# Patient Record
Sex: Female | Born: 1980 | Race: Black or African American | Hispanic: No | Marital: Single | Smoking: Current every day smoker
Health system: Southern US, Community
[De-identification: ages and names within clinical notes are randomized; demographics above are authoritative.]

## PROBLEM LIST (undated history)

## (undated) DIAGNOSIS — G8220 Paraplegia, unspecified: Secondary | ICD-10-CM

## (undated) HISTORY — PX: KIDNEY SURGERY: SHX687

## (undated) HISTORY — PX: COLOSTOMY: SHX63

---

## 2014-01-25 ENCOUNTER — Encounter (HOSPITAL_COMMUNITY): Payer: Self-pay | Admitting: Emergency Medicine

## 2014-01-25 ENCOUNTER — Emergency Department (HOSPITAL_COMMUNITY): Payer: Self-pay

## 2014-01-25 ENCOUNTER — Emergency Department (HOSPITAL_COMMUNITY)
Admission: EM | Admit: 2014-01-25 | Discharge: 2014-01-26 | Disposition: A | Discharge status: Home / Self Care | Payer: Self-pay | Attending: Emergency Medicine | Admitting: Emergency Medicine

## 2014-01-25 DIAGNOSIS — Z3202 Encounter for pregnancy test, result negative: Secondary | ICD-10-CM | POA: Insufficient documentation

## 2014-01-25 DIAGNOSIS — R109 Unspecified abdominal pain: Secondary | ICD-10-CM

## 2014-01-25 DIAGNOSIS — R509 Fever, unspecified: Secondary | ICD-10-CM | POA: Insufficient documentation

## 2014-01-25 DIAGNOSIS — Z79899 Other long term (current) drug therapy: Secondary | ICD-10-CM | POA: Insufficient documentation

## 2014-01-25 DIAGNOSIS — Z9889 Other specified postprocedural states: Secondary | ICD-10-CM | POA: Insufficient documentation

## 2014-01-25 DIAGNOSIS — R638 Other symptoms and signs concerning food and fluid intake: Secondary | ICD-10-CM | POA: Insufficient documentation

## 2014-01-25 DIAGNOSIS — F17200 Nicotine dependence, unspecified, uncomplicated: Secondary | ICD-10-CM | POA: Insufficient documentation

## 2014-01-25 DIAGNOSIS — Z8669 Personal history of other diseases of the nervous system and sense organs: Secondary | ICD-10-CM | POA: Insufficient documentation

## 2014-01-25 DIAGNOSIS — Z87828 Personal history of other (healed) physical injury and trauma: Secondary | ICD-10-CM | POA: Insufficient documentation

## 2014-01-25 DIAGNOSIS — R1084 Generalized abdominal pain: Secondary | ICD-10-CM | POA: Insufficient documentation

## 2014-01-25 HISTORY — DX: Paraplegia, unspecified: G82.20

## 2014-01-25 LAB — COMPREHENSIVE METABOLIC PANEL
ALBUMIN: 4.1 g/dL (ref 3.5–5.2)
ALK PHOS: 88 U/L (ref 39–117)
ALT: 16 U/L (ref 0–35)
AST: 22 U/L (ref 0–37)
BUN: 10 mg/dL (ref 6–23)
CHLORIDE: 103 meq/L (ref 96–112)
CO2: 22 mEq/L (ref 19–32)
Calcium: 9.8 mg/dL (ref 8.4–10.5)
Creatinine, Ser: 0.54 mg/dL (ref 0.50–1.10)
GFR calc Af Amer: >90 mL/min (ref >90)
GFR calc non Af Amer: >90 mL/min (ref >90)
Glucose, Bld: 93 mg/dL (ref 70–99)
POTASSIUM: 3.5 meq/L — AB (ref 3.7–5.3)
Sodium: 142 mEq/L (ref 137–147)
Total Bilirubin: 0.5 mg/dL (ref 0.3–1.2)
Total Protein: 8.2 g/dL (ref 6.0–8.3)

## 2014-01-25 LAB — URINALYSIS, ROUTINE W REFLEX MICROSCOPIC
BILIRUBIN URINE: NEGATIVE
GLUCOSE, UA: NEGATIVE mg/dL
HGB URINE DIPSTICK: NEGATIVE
Ketones, ur: 40 mg/dL — AB
Leukocytes, UA: NEGATIVE
Nitrite: NEGATIVE
Protein, ur: NEGATIVE mg/dL
SPECIFIC GRAVITY, URINE: 1.027 (ref 1.005–1.030)
Urobilinogen, UA: 1 mg/dL (ref 0.0–1.0)
pH: 6 (ref 5.0–8.0)

## 2014-01-25 LAB — POC URINE PREG, ED: Preg Test, Ur: NEGATIVE

## 2014-01-25 LAB — LIPASE, BLOOD: LIPASE: 18 U/L (ref 11–59)

## 2014-01-25 LAB — POC OCCULT BLOOD, ED: Fecal Occult Bld: POSITIVE — AB

## 2014-01-25 MED ORDER — IOHEXOL 300 MG/ML  SOLN
80.0000 mL | Freq: Once | INTRAMUSCULAR | Status: AC | PRN
  Administered 2014-01-25: 80 mL via INTRAVENOUS

## 2014-01-25 MED ORDER — ONDANSETRON HCL 4 MG/2ML IJ SOLN
4.0000 mg | Freq: Once | INTRAMUSCULAR | Status: AC
  Administered 2014-01-25: 4 mg via INTRAVENOUS
  Filled 2014-01-25: qty 2

## 2014-01-25 MED ORDER — IOHEXOL 300 MG/ML  SOLN
25.0000 mL | Freq: Once | INTRAMUSCULAR | Status: AC | PRN
  Administered 2014-01-25: 25 mL via ORAL

## 2014-01-25 MED ORDER — HYDROMORPHONE HCL PF 1 MG/ML IJ SOLN
1.0000 mg | Freq: Once | INTRAMUSCULAR | Status: AC
  Administered 2014-01-25: 1 mg via INTRAVENOUS
  Filled 2014-01-25: qty 1
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
## 2014-01-25 NOTE — ED Notes (Addendum)
After zofran pt has an itching feeling in her arm. Went to give the dilaudid and patient then had a couple of hives on her arm. Pt states that she has had zofran before and did not have a reaction. Pt was scratching her arm after the zofran. Pt offered benadryl for itching, pt denies need for benadryl currently but will tell me if she changes her mind. Pt also states that she feels the same ways now that she did when she had c-diff. Pt states her stool is the same color as when she had c-diff and smells the same as when she had c-diff.

## 2014-01-25 NOTE — ED Notes (Signed)
No large amount of blood noted hemoccult collected

## 2014-01-25 NOTE — ED Notes (Signed)
Pt self-cathed  

## 2014-01-25 NOTE — ED Notes (Signed)
Pt states she wants to wait on lab work until she is in a room

## 2014-01-25 NOTE — ED Notes (Addendum)
Pt reports generalized abdominal pain for several hours. Vomiting x 2 and diarrhea. Also has chills. Pt is a x 4. In NAD. Does have colostomy.

## 2014-01-25 NOTE — ED Notes (Signed)
Informed Dr. Patria Maneampos that patient is still 10/10 pain and she would lke to speak with him

## 2014-01-25 NOTE — ED Notes (Signed)
Pt refusing to have labs drawn up in triage. States she is terrified of needles. Pt told can have phlebotomy come and draw, is refusing until she gets in the back.

## 2014-01-26 ENCOUNTER — Telehealth (HOSPITAL_COMMUNITY): Payer: Self-pay | Admitting: Emergency Medicine

## 2014-01-26 ENCOUNTER — Encounter (HOSPITAL_COMMUNITY): Payer: Self-pay | Admitting: Emergency Medicine

## 2014-01-26 ENCOUNTER — Observation Stay (HOSPITAL_COMMUNITY)
Admission: EM | Admit: 2014-01-26 | Discharge: 2014-01-27 | Disposition: A | Discharge status: Home / Self Care | Payer: Self-pay | Attending: Internal Medicine | Admitting: Internal Medicine

## 2014-01-26 DIAGNOSIS — E876 Hypokalemia: Secondary | ICD-10-CM | POA: Diagnosis present

## 2014-01-26 DIAGNOSIS — Z933 Colostomy status: Secondary | ICD-10-CM | POA: Insufficient documentation

## 2014-01-26 DIAGNOSIS — N83209 Unspecified ovarian cyst, unspecified side: Secondary | ICD-10-CM | POA: Diagnosis present

## 2014-01-26 DIAGNOSIS — X58XXXA Exposure to other specified factors, initial encounter: Secondary | ICD-10-CM | POA: Insufficient documentation

## 2014-01-26 DIAGNOSIS — IMO0002 Reserved for concepts with insufficient information to code with codable children: Secondary | ICD-10-CM | POA: Insufficient documentation

## 2014-01-26 DIAGNOSIS — R112 Nausea with vomiting, unspecified: Secondary | ICD-10-CM | POA: Diagnosis present

## 2014-01-26 DIAGNOSIS — R109 Unspecified abdominal pain: Secondary | ICD-10-CM | POA: Diagnosis present

## 2014-01-26 DIAGNOSIS — R1013 Epigastric pain: Principal | ICD-10-CM | POA: Insufficient documentation

## 2014-01-26 DIAGNOSIS — N920 Excessive and frequent menstruation with regular cycle: Secondary | ICD-10-CM | POA: Insufficient documentation

## 2014-01-26 DIAGNOSIS — F17200 Nicotine dependence, unspecified, uncomplicated: Secondary | ICD-10-CM | POA: Insufficient documentation

## 2014-01-26 DIAGNOSIS — R1115 Cyclical vomiting syndrome unrelated to migraine: Secondary | ICD-10-CM | POA: Insufficient documentation

## 2014-01-26 DIAGNOSIS — G8220 Paraplegia, unspecified: Secondary | ICD-10-CM | POA: Diagnosis present

## 2014-01-26 LAB — CBC WITH DIFFERENTIAL/PLATELET
BASOS ABS: 0 10*3/uL (ref 0.0–0.1)
Basophils Relative: 0 % (ref 0–1)
EOS PCT: 3 % (ref 0–5)
Eosinophils Absolute: 0.1 10*3/uL (ref 0.0–0.7)
HEMATOCRIT: 32.7 % — AB (ref 36.0–46.0)
Hemoglobin: 10.2 g/dL — ABNORMAL LOW (ref 12.0–15.0)
LYMPHS ABS: 2.4 10*3/uL (ref 0.7–4.0)
Lymphocytes Relative: 52 % — ABNORMAL HIGH (ref 12–46)
MCH: 24.8 pg — ABNORMAL LOW (ref 26.0–34.0)
MCHC: 31.2 g/dL (ref 30.0–36.0)
MCV: 79.6 fL (ref 78.0–100.0)
Monocytes Absolute: 0.3 10*3/uL (ref 0.1–1.0)
Monocytes Relative: 7 % (ref 3–12)
Neutro Abs: 1.8 10*3/uL (ref 1.7–7.7)
Neutrophils Relative %: 38 % — ABNORMAL LOW (ref 43–77)
Platelets: 345 10*3/uL (ref 150–400)
RBC: 4.11 MIL/uL (ref 3.87–5.11)
RDW: 17 % — AB (ref 11.5–15.5)
WBC: 4.7 10*3/uL (ref 4.0–10.5)

## 2014-01-26 LAB — CA 125: CA 125: 4.1 U/mL (ref 0.0–30.2)

## 2014-01-26 MED ORDER — POTASSIUM CHLORIDE IN NACL 40-0.9 MEQ/L-% IV SOLN
INTRAVENOUS | Status: DC
  Administered 2014-01-26: 12:00:00 via INTRAVENOUS
  Administered 2014-01-27: 100 mL/h via INTRAVENOUS
  Filled 2014-01-26 (×5): qty 1000

## 2014-01-26 MED ORDER — PROMETHAZINE HCL 25 MG PO TABS: 25.0000 mg | ORAL_TABLET | Freq: Four times a day (QID) | ORAL | Status: DC | PRN

## 2014-01-26 MED ORDER — ONDANSETRON HCL 4 MG PO TABS: 4.0000 mg | ORAL_TABLET | Freq: Four times a day (QID) | ORAL | Status: DC | PRN

## 2014-01-26 MED ORDER — OXYBUTYNIN CHLORIDE ER 5 MG PO TB24: 5.0000 mg | ORAL_TABLET | Freq: Every day | ORAL | Status: DC

## 2014-01-26 MED ORDER — PROMETHAZINE HCL 25 MG/ML IJ SOLN
25.0000 mg | Freq: Once | INTRAMUSCULAR | Status: DC
  Filled 2014-01-26: qty 1

## 2014-01-26 MED ORDER — ONDANSETRON 4 MG PO TBDP: 4.0000 mg | ORAL_TABLET | Freq: Once | ORAL | Status: DC

## 2014-01-26 MED ORDER — HYDROMORPHONE HCL PF 1 MG/ML IJ SOLN
1.0000 mg | Freq: Once | INTRAMUSCULAR | Status: AC
  Administered 2014-01-26: 1 mg via INTRAVENOUS
  Filled 2014-01-26: qty 1

## 2014-01-26 MED ORDER — ONDANSETRON HCL 4 MG/2ML IJ SOLN
4.0000 mg | Freq: Once | INTRAMUSCULAR | Status: AC
  Administered 2014-01-26: 4 mg via INTRAVENOUS

## 2014-01-26 MED ORDER — ONDANSETRON HCL 4 MG/2ML IJ SOLN
4.0000 mg | Freq: Four times a day (QID) | INTRAMUSCULAR | Status: DC | PRN
Start: 2014-01-26 — End: 2014-01-27
  Administered 2014-01-26: 4 mg via INTRAVENOUS
  Filled 2014-01-26 (×2): qty 2

## 2014-01-26 MED ORDER — ONDANSETRON HCL 4 MG/2ML IJ SOLN
INTRAMUSCULAR | Status: AC
  Filled 2014-01-26: qty 2

## 2014-01-26 MED ORDER — HYDROMORPHONE HCL PF 1 MG/ML IJ SOLN
1.0000 mg | INTRAMUSCULAR | Status: DC | PRN
  Administered 2014-01-26 – 2014-01-27 (×7): 2 mg via INTRAVENOUS
  Filled 2014-01-26 (×8): qty 2

## 2014-01-26 MED ORDER — ENOXAPARIN SODIUM 40 MG/0.4ML ~~LOC~~ SOLN
40.0000 mg | SUBCUTANEOUS | Status: DC
  Administered 2014-01-26: 40 mg via SUBCUTANEOUS
  Filled 2014-01-26 (×2): qty 0.4

## 2014-01-26 MED ORDER — OXYCODONE-ACETAMINOPHEN 5-325 MG PO TABS: 1.0000 | ORAL_TABLET | ORAL | Status: DC | PRN

## 2014-01-26 NOTE — Progress Notes (Signed)
Unit CM UR Completed by [facility] ED CM  W. Mckinzee Spirito RN  

## 2014-01-26 NOTE — ED Notes (Signed)
Patient is experiencing lower mid abdominal pain with emesis x2 days. Patient has colostomy. Patient reports low grade fevers over last 2 days. Denies hematemesis.
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
## 2014-01-26 NOTE — Progress Notes (Signed)
Admitted patient from E.D. In her own motorized wheelchair,awake alert x4,not in distress,patient is paraplegic for years now but very independent on transfering herself from/to bed and her wheelchair.Some skin issues noted on her bilateral toes.On the left foot,from great toe to 3rd toe-entirely covered of dry dark areas with some callous formation on it.No open areas,no drainage ,no d.t.i. ,negative for foul odor.Likewise can be said on the rt 2nd and 5th toe.Some maldeformation /misallingment noted,patient said felt some numbness on those areas.Wound care consult ordered.Will notify m.d on call.

## 2014-01-26 NOTE — ED Notes (Signed)
Apple juice given to pt  

## 2014-01-26 NOTE — Progress Notes (Signed)
Patient refused x-ray on her feet.
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
## 2014-01-26 NOTE — ED Notes (Signed)
MD Oncology services at bedside.

## 2014-01-26 NOTE — ED Notes (Signed)
Pt continues to use her own motorized wheelchair to go around the department to use her own cell phone and to use bathrooms with in other pods. Pt refuses to use the bathroom in Pod D

## 2014-01-26 NOTE — H&P (Signed)
Date: 01/26/2014               Patient Name:  Mariah Gray MRN: [id number]  DOB: December 13, 1980 Age / Sex: 33 y.o., female   PCP: No Pcp Per Patient         Medical Service: Internal Medicine Teaching Service         Attending Physician: Dr. Jonah Blue, DO    First Contact: Dr. Yetta Barre Pager: [phone]  Second Contact: Dr. Virgina Organ Pager: [phone]       After Hours (After 5p/  First Contact Pager: [phone]  weekends / holidays): Second Contact Pager: [phone]   Chief Complaint: abdominal pain with nausea and vomiting  History of Present Illness:  The patient is a 33 YO female who came into the hospital with abdominal pain and nausea and vomiting. She was recently seen and discharged from the emergency department however she was trying to eat a sandwich in the waiting room with more vomiting and was brought back into the ED. She states that this started in the last week or so and she has been having pain in her lower abdomen. 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 She has not had recent antibiotics and takes no medicines on a daily basis. She has not noticed any weight loss. She denies any chills but may have had a fever. She denies current sexual activity and has not had any new partners in some time. She does self catheterize and has not noticed any difference in color or appearance recently. She denies chest pain or SOB. She is concerned now about the area on her ovary that they found on the imaging tests and is upset over what it could be. She denies vomiting blood and states that she has been vomiting with anything she tries to eat or drink. She denies current alcohol usage or drug usage. She is an everyday smoker.   Meds: Current Facility-Administered Medications  Medication Dose Route Frequency Provider Last Rate Last Dose  . HYDROmorphone  (DILAUDID) injection 1 mg  1 mg Intravenous Once Roxy Horseman, PA-C      . ondansetron [facility]) tablet 4 mg  4 mg Oral Q6H PRN Judie Bonus, MD       Or  . ondansetron [facility]) injection 4 mg  4 mg Intravenous Q6H PRN Judie Bonus, MD      . promethazine (PHENERGAN) injection 25 mg  25 mg Intravenous Once Roxy Horseman, PA-C       Current Outpatient Prescriptions  Medication Sig Dispense Refill  . oxybutynin (DITROPAN-XL) 5 MG 24 hr tablet Take 1 tablet (5 mg total) by mouth daily.  30 tablet  0  . oxyCODONE-acetaminophen (PERCOCET/ROXICET) 5-325 MG per tablet Take 1 tablet by mouth every 4 (four) hours as needed for severe pain.  12 tablet  0  . promethazine (PHENERGAN) 25 MG tablet Take 1 tablet (25 mg total) by mouth every 6 (six) hours as needed for nausea or vomiting.  30 tablet  0    Allergies: Allergies as of 01/26/2014 - Review Complete 01/26/2014  Allergen Reaction Noted  . Levaquin [levofloxacin] Other (See Comments) 01/25/2014  . Morphine and related Hives and Other (See Comments) 01/25/2014   Past Medical History  Diagnosis Date  . Paraplegic spinal paralysis    Past Surgical History  Procedure Laterality  Date  . Kidney surgery    . Colostomy     No family history on file. History   Social History  . Marital Status: Single    Spouse Name: N/A    Number of Children: N/A  . Years of Education: N/A   Occupational History  . Not on file.   Social History Main Topics  . Smoking status: Current Every Day Smoker  . Smokeless tobacco: Not on file  . Alcohol Use: No  . Drug Use: No  . Sexual Activity: Not on file   Other Topics Concern  . Not on file   Social History Narrative  . No narrative on file    Review of Systems: Pertinent items are noted in HPI.  Physical Exam: Blood pressure 107/75, pulse 85, resp. rate 14, last menstrual period 01/23/2014, SpO2 99.00%. General: resting in bed, in mild distress HEENT: PERRL, EOMI, no scleral  icterus Cardiac: S1 S2 normal, rate normal Pulm: no rales, rhonchi, wheezing, good air movement bilaterally Abd: soft, tender in the lower half of the abdomen, colostomy on left abdomen wall with light brown stool in the bag, no blood noted, no erythema surrounding the site, nondistended, BS present Ext: warm and well perfused, no pedal edema, several wounds on the left foot toes without signs of infection but with chronic injury, old healing wound on the ankle lateral, good pulses dp and pt left and right foot, right foot with old areas of darker skin on several toes without any open sores or wounds, also no interdigit breakdown or fungus on either foot, both feet with dry skin Neuro: alert and oriented X3, cranial nerves II-XII grossly intact   Lab results: Basic Metabolic Panel:  Recent Labs  16/07/9603/22/15 1710  NA 142  K 3.5*  CL 103  CO2 22  GLUCOSE 93  BUN 10  CREATININE 0.54  CALCIUM 9.8   Liver Function Tests:  Recent Labs  01/25/14 1710  AST 22  ALT 16  ALKPHOS 88  BILITOT 0.5  PROT 8.2  ALBUMIN 4.1    Recent Labs  01/25/14 1710  LIPASE 18   Urinalysis:  Recent Labs  01/25/14 1736  COLORURINE YELLOW  LABSPEC 1.027  PHURINE 6.0  GLUCOSEU NEGATIVE  HGBUR NEGATIVE  BILIRUBINUR NEGATIVE  KETONESUR 40*  PROTEINUR NEGATIVE  UROBILINOGEN 1.0  NITRITE NEGATIVE  LEUKOCYTESUR NEGATIVE   Imaging results:  [place] Transvaginal Non-ob  01/25/2014   CLINICAL DATA:  Pelvic pain  EXAM: TRANSABDOMINAL AND TRANSVAGINAL ULTRASOUND OF PELVIS  DOPPLER ULTRASOUND OF OVARIES  TECHNIQUE: Both transabdominal and transvaginal ultrasound examinations of the pelvis were performed. Transabdominal technique was performed for global imaging of the pelvis including uterus, ovaries, adnexal regions, and pelvic cul-de-sac.  It was necessary to proceed with endovaginal exam following the transabdominal exam to visualize the adnexae. Color and duplex Doppler ultrasound was utilized to evaluate  blood flow to the ovaries.  COMPARISON:  None.  FINDINGS: Uterus  Measurements: 7.2 x 4.6 x 4.6 cm. The myometrium is heterogeneous without focal mass.  Endometrium  Thickness: 10 mm an uniform.  No focal abnormality visualized.  Right ovary  Measurements: 2.4 x 2.1 x 1.7 cm. Normal appearance/no adnexal mass.  Left ovary  Measurements: 8.8 x 6.2 x 5.6 cm. A large cystic lesion with thick septations occupies most of the left ovary. Little if any internal vascularity within the cystic lesion.  Pulsed Doppler evaluation of both ovaries demonstrates normal low-resistance arterial and venous waveforms.  Other findings  Moderate amount of free fluid is present.  IMPRESSION: No evidence of ovarian torsion.  Myometrium is heterogeneous. Fibroids or adenomyosis are not excluded. MRI may be helpful.  Large cystic lesion in the left ovary with thick septations. Cystic neoplasm is not excluded. Surgical consultation is warranted.   Electronically Signed   By: Maryclare Bean M.D.   On: 01/25/2014 22:08   US Pelvis Complete  01/25/2014   CLINICAL DATA:  Pelvic pain  EXAM: TRANSABDOMINAL AND TRANSVAGINAL ULTRASOUND OF PELVIS  DOPPLER ULTRASOUND OF OVARIES  TECHNIQUE: Both transabdominal and transvaginal ultrasound examinations of the pelvis were performed. Transabdominal technique was performed for global imaging of the pelvis including uterus, ovaries, adnexal regions, and pelvic cul-de-sac.  It was necessary to proceed with endovaginal exam following the transabdominal exam to visualize the adnexae. Color and duplex Doppler ultrasound was utilized to evaluate blood flow to the ovaries.  COMPARISON:  None.  FINDINGS: Uterus  Measurements: 7.2 x 4.6 x 4.6 cm. The myometrium is heterogeneous without focal mass.  Endometrium  Thickness: 10 mm an uniform.  No focal abnormality visualized.  Right ovary  Measurements: 2.4 x 2.1 x 1.7 cm. Normal appearance/no adnexal mass.  Left ovary  Measurements: 8.8 x 6.2 x 5.6 cm. A large cystic  lesion with thick septations occupies most of the left ovary. Little if any internal vascularity within the cystic lesion.  Pulsed Doppler evaluation of both ovaries demonstrates normal low-resistance arterial and venous waveforms.  Other findings  Moderate amount of free fluid is present.  IMPRESSION: No evidence of ovarian torsion.  Myometrium is heterogeneous. Fibroids or adenomyosis are not excluded. MRI may be helpful.  Large cystic lesion in the left ovary with thick septations. Cystic neoplasm is not excluded. Surgical consultation is warranted.   Electronically Signed   By: Maryclare Bean M.D.   On: 01/25/2014 22:08   Ct Abdomen Pelvis W Contrast  01/25/2014   CLINICAL DATA:  Abdominal pain, vomiting, chills.  EXAM: CT ABDOMEN AND PELVIS WITH CONTRAST  TECHNIQUE: Multidetector CT imaging of the abdomen and pelvis was performed using the standard protocol following bolus administration of intravenous contrast.  CONTRAST:  80mL OMNIPAQUE IOHEXOL 300 MG/ML  SOLN  COMPARISON:  None.  FINDINGS: Minimal linear scarring laterally at the left lung base. Unremarkable liver, spleen, adrenal glands. Absent left kidney. Compensatory hypertrophy of the right kidney which is otherwise unremarkable. Linear metallic fragments are noted in the right posterior paraspinal tissues, spinal canal at the L2 level, and in left lateral paraspinal soft tissues. Stomach and small bowel decompressed. There is a left lower quadrant colostomy. Urinary bladder is nondistended. There is a septated cystic process in the left pelvis, measured up to 10.3 cm, slightly displacing the uterus to the right of midline. The left ovary is not separately identified. No ascites. No free air. No definite adenopathy.  IMPRESSION: 1. 10.3 cm septated cystic left pelvic process. Primary considerations include ovarian pathology, hydrosalpinx, or pyosalpinx. Pelvic ultrasound may be useful further characterization.   Electronically Signed   By: Oley Balm  M.D.   On: 01/25/2014 19:41   [place] Art/ven Flow Abd Pelv Doppler  01/25/2014   CLINICAL DATA:  Pelvic pain  EXAM: TRANSABDOMINAL AND TRANSVAGINAL ULTRASOUND OF PELVIS  DOPPLER ULTRASOUND OF OVARIES  TECHNIQUE: Both transabdominal and transvaginal ultrasound examinations of the pelvis were performed. Transabdominal technique was performed for global imaging of the pelvis including uterus, ovaries, adnexal regions, and pelvic cul-de-sac.  It was necessary to proceed with endovaginal  exam following the transabdominal exam to visualize the adnexae. Color and duplex Doppler ultrasound was utilized to evaluate blood flow to the ovaries.  COMPARISON:  None.  FINDINGS: Uterus  Measurements: 7.2 x 4.6 x 4.6 cm. The myometrium is heterogeneous without focal mass.  Endometrium  Thickness: 10 mm an uniform.  No focal abnormality visualized.  Right ovary  Measurements: 2.4 x 2.1 x 1.7 cm. Normal appearance/no adnexal mass.  Left ovary  Measurements: 8.8 x 6.2 x 5.6 cm. A large cystic lesion with thick septations occupies most of the left ovary. Little if any internal vascularity within the cystic lesion.  Pulsed Doppler evaluation of both ovaries demonstrates normal low-resistance arterial and venous waveforms.  Other findings  Moderate amount of free fluid is present.  IMPRESSION: No evidence of ovarian torsion.  Myometrium is heterogeneous. Fibroids or adenomyosis are not excluded. MRI may be helpful.  Large cystic lesion in the left ovary with thick septations. Cystic neoplasm is not excluded. Surgical consultation is warranted.   Electronically Signed   By: Maryclare BeanArt  Hoss M.D.   On: 01/25/2014 22:08   Assessment & Plan by Problem:  Abdominal pain associated Nausea with vomiting - Abdominal CT able to exclude most acute pathology although concern for ovarian cyst remains. Will need OB/GYN for opinion of management. No new or recent sexual partners so concern for pyogenic cyst is lower on differential. No UTI, pancreatitis. No  change in her stool in colostomy although with her history of C dif with similar symptoms will check C dif by PCR.  -Diet clear liquid -dilaudid for pain and zofran for nausea -Appreciate OB/GYN recommendations  Hypokalemia - K 3.5 on BMP in ED and will add KCl to her fluids and recheck magnesium and potassium in the AM. -NS w/40 KCl @ 100 cc/hr for 20 hours -BMP in AM and magnesium in AM  Ovarian cyst - Newly found 8.8cm by 6.2 cm by 5.6 cm lesion on the left ovary. Concern for malignancy versus pyogenic with associated free fluid around the cyst. OB/GYN consulted in the ED and will see patient today. May need surgical consultation if concern for malignancy. Ca-125 checked in ED and pending. -Follow Ca-125 -Ob/GYN recommendations appreciated -Control pain and nausea with zofran and dilaudid  Paraplegia - No sensation below the waist and able to be independent at home with electronic wheelchair. No family in town [place] in [place]   Wounds on left foot toes - Likely due to pressure given her paraplegia. Will need to make sure she has appropriately fitting shoes. Will consult wound care for help with pressure offloading while inpatient. None of the areas are open or draining and do not appear infected.  -Wound care consult  DVT ppx - lovenox [place] daily  Dispo: Disposition is deferred at this time, awaiting improvement of current medical problems. Anticipated discharge in approximately 1-2 day(s).   The patient does not have a current PCP (No Pcp Per Patient) and [facility].  The patient does not have transportation limitations that hinder transportation to clinic appointments.  Signed: Judie BonusElizabeth A Kollar, MD 01/26/2014, 8:29 AM

## 2014-01-26 NOTE — ED Notes (Signed)
Pt transported to 6E room 2 via her own wheel chair, per pt's request, pt escorted to 6E by Amy NT, report given to Mathis FareAlbert

## 2014-01-26 NOTE — ED Provider Notes (Signed)
CSN: [id number]     Arrival date & time 01/26/14  0546 History   First MD Initiated Contact with Patient 01/26/14 0602     Chief Complaint  Patient presents with  . Emesis  . Abdominal Pain     (Consider location/radiation/quality/duration/timing/severity/associated sxs/prior Treatment) HPI Comments: Patient presents to the ED with a chief complaint of abdominal pain and nausea and vomiting.  She states that she was just seen last night for the same.  She was released to home with instructions to follow-up with GYN regarding a suspicious cyst on her left ovary.  She states that after being discharged earlier in the evening, she ate a sandwich and went to bed.  She then started having nausea, vomiting, and abdominal pain again.  She states that she was not able to fill her medications after discharge.  There are no aggravating or alleviating factors.    The history is provided by the patient. No language interpreter was used.    Past Medical History  Diagnosis Date  . Paraplegic spinal paralysis    Past Surgical History  Procedure Laterality Date  . Kidney surgery    . Colostomy     No family history on file. History  Substance Use Topics  . Smoking status: Current Every Day Smoker  . Smokeless tobacco: Not on file  . Alcohol Use: No   OB History   Grav Para Term Preterm Abortions TAB SAB Ect Mult Living                 Review of Systems  Constitutional: Negative for fever and chills.  Respiratory: Negative for shortness of breath.   Cardiovascular: Negative for chest pain.  Gastrointestinal: Positive for nausea, vomiting and abdominal pain. Negative for diarrhea and constipation.  Genitourinary: Negative for dysuria.      Allergies  Levaquin and Morphine and related  Home Medications   Prior to Admission medications   Medication Sig Start Date End Date Taking? Authorizing Provider  oxybutynin (DITROPAN-XL) 5 MG 24 hr tablet Take 1 tablet (5 mg total) by mouth  daily. 01/26/14   Lyanne Co, MD  oxyCODONE-acetaminophen (PERCOCET/ROXICET) 5-325 MG per tablet Take 1 tablet by mouth every 4 (four) hours as needed for severe pain. 01/26/14   Lyanne Co, MD  promethazine (PHENERGAN) 25 MG tablet Take 1 tablet (25 mg total) by mouth every 6 (six) hours as needed for nausea or vomiting. 01/26/14   Lyanne Co, MD   LMP 01/23/2014 Physical Exam  Nursing note and vitals reviewed. Constitutional: She is oriented to person, place, and time. She appears well-developed and well-nourished.  HENT:  Head: Normocephalic and atraumatic.  Eyes: Conjunctivae and EOM are normal. Pupils are equal, round, and reactive to light.  Neck: Normal range of motion. Neck supple.  Cardiovascular: Normal rate and regular rhythm.  Exam reveals no gallop and no friction rub.   No murmur heard. Pulmonary/Chest: Effort normal and breath sounds normal. No respiratory distress. She has no wheezes. She has no rales. She exhibits no tenderness.  Abdominal: Soft. Bowel sounds are normal. She exhibits no distension and no mass. There is tenderness. There is no rebound and no guarding.  Abdomen is diffusely uncomfortable, colostomy in place in LLQ, no surrounding erythema or evidence of infection, normal brown stool  Musculoskeletal: Normal range of motion. She exhibits no edema and no tenderness.  Neurological: She is alert and oriented to person, place, and time.  Skin: Skin is warm and dry.  Psychiatric: She has a normal mood and affect. Her behavior is normal. Judgment and thought content normal.    ED Course  Procedures (including critical care time) Labs Review Labs Reviewed - No data to display  Imaging Review [place] Transvaginal Non-ob  01/25/2014   CLINICAL DATA:  Pelvic pain  EXAM: TRANSABDOMINAL AND TRANSVAGINAL ULTRASOUND OF PELVIS  DOPPLER ULTRASOUND OF OVARIES  TECHNIQUE: Both transabdominal and transvaginal ultrasound examinations of the pelvis were performed.  Transabdominal technique was performed for global imaging of the pelvis including uterus, ovaries, adnexal regions, and pelvic cul-de-sac.  It was necessary to proceed with endovaginal exam following the transabdominal exam to visualize the adnexae. Color and duplex Doppler ultrasound was utilized to evaluate blood flow to the ovaries.  COMPARISON:  None.  FINDINGS: Uterus  Measurements: 7.2 x 4.6 x 4.6 cm. The myometrium is heterogeneous without focal mass.  Endometrium  Thickness: 10 mm an uniform.  No focal abnormality visualized.  Right ovary  Measurements: 2.4 x 2.1 x 1.7 cm. Normal appearance/no adnexal mass.  Left ovary  Measurements: 8.8 x 6.2 x 5.6 cm. A large cystic lesion with thick septations occupies most of the left ovary. Little if any internal vascularity within the cystic lesion.  Pulsed Doppler evaluation of both ovaries demonstrates normal low-resistance arterial and venous waveforms.  Other findings  Moderate amount of free fluid is present.  IMPRESSION: No evidence of ovarian torsion.  Myometrium is heterogeneous. Fibroids or adenomyosis are not excluded. MRI may be helpful.  Large cystic lesion in the left ovary with thick septations. Cystic neoplasm is not excluded. Surgical consultation is warranted.   Electronically Signed   By: Maryclare BeanArt  Hoss M.D.   On: 01/25/2014 22:08   [place] Pelvis Complete  01/25/2014   CLINICAL DATA:  Pelvic pain  EXAM: TRANSABDOMINAL AND TRANSVAGINAL ULTRASOUND OF PELVIS  DOPPLER ULTRASOUND OF OVARIES  TECHNIQUE: Both transabdominal and transvaginal ultrasound examinations of the pelvis were performed. Transabdominal technique was performed for global imaging of the pelvis including uterus, ovaries, adnexal regions, and pelvic cul-de-sac.  It was necessary to proceed with endovaginal exam following the transabdominal exam to visualize the adnexae. Color and duplex Doppler ultrasound was utilized to evaluate blood flow to the ovaries.  COMPARISON:  None.  FINDINGS: Uterus   Measurements: 7.2 x 4.6 x 4.6 cm. The myometrium is heterogeneous without focal mass.  Endometrium  Thickness: 10 mm an uniform.  No focal abnormality visualized.  Right ovary  Measurements: 2.4 x 2.1 x 1.7 cm. Normal appearance/no adnexal mass.  Left ovary  Measurements: 8.8 x 6.2 x 5.6 cm. A large cystic lesion with thick septations occupies most of the left ovary. Little if any internal vascularity within the cystic lesion.  Pulsed Doppler evaluation of both ovaries demonstrates normal low-resistance arterial and venous waveforms.  Other findings  Moderate amount of free fluid is present.  IMPRESSION: No evidence of ovarian torsion.  Myometrium is heterogeneous. Fibroids or adenomyosis are not excluded. MRI may be helpful.  Large cystic lesion in the left ovary with thick septations. Cystic neoplasm is not excluded. Surgical consultation is warranted.   Electronically Signed   By: Maryclare BeanArt  Hoss M.D.   On: 01/25/2014 22:08   Ct Abdomen Pelvis W Contrast  01/25/2014   CLINICAL DATA:  Abdominal pain, vomiting, chills.  EXAM: CT ABDOMEN AND PELVIS WITH CONTRAST  TECHNIQUE: Multidetector CT imaging of the abdomen and pelvis was performed using the standard protocol following bolus administration of intravenous contrast.  CONTRAST:  80mL  OMNIPAQUE IOHEXOL 300 MG/ML  SOLN  COMPARISON:  None.  FINDINGS: Minimal linear scarring laterally at the left lung base. Unremarkable liver, spleen, adrenal glands. Absent left kidney. Compensatory hypertrophy of the right kidney which is otherwise unremarkable. Linear metallic fragments are noted in the right posterior paraspinal tissues, spinal canal at the L2 level, and in left lateral paraspinal soft tissues. Stomach and small bowel decompressed. There is a left lower quadrant colostomy. Urinary bladder is nondistended. There is a septated cystic process in the left pelvis, measured up to 10.3 cm, slightly displacing the uterus to the right of midline. The left ovary is not  separately identified. No ascites. No free air. No definite adenopathy.  IMPRESSION: 1. 10.3 cm septated cystic left pelvic process. Primary considerations include ovarian pathology, hydrosalpinx, or pyosalpinx. Pelvic ultrasound may be useful further characterization.   Electronically Signed   By: Oley Balmaniel  Hassell M.D.   On: 01/25/2014 19:41   [place] Art/ven Flow Abd Pelv Doppler  01/25/2014   CLINICAL DATA:  Pelvic pain  EXAM: TRANSABDOMINAL AND TRANSVAGINAL ULTRASOUND OF PELVIS  DOPPLER ULTRASOUND OF OVARIES  TECHNIQUE: Both transabdominal and transvaginal ultrasound examinations of the pelvis were performed. Transabdominal technique was performed for global imaging of the pelvis including uterus, ovaries, adnexal regions, and pelvic cul-de-sac.  It was necessary to proceed with endovaginal exam following the transabdominal exam to visualize the adnexae. Color and duplex Doppler ultrasound was utilized to evaluate blood flow to the ovaries.  COMPARISON:  None.  FINDINGS: Uterus  Measurements: 7.2 x 4.6 x 4.6 cm. The myometrium is heterogeneous without focal mass.  Endometrium  Thickness: 10 mm an uniform.  No focal abnormality visualized.  Right ovary  Measurements: 2.4 x 2.1 x 1.7 cm. Normal appearance/no adnexal mass.  Left ovary  Measurements: 8.8 x 6.2 x 5.6 cm. A large cystic lesion with thick septations occupies most of the left ovary. Little if any internal vascularity within the cystic lesion.  Pulsed Doppler evaluation of both ovaries demonstrates normal low-resistance arterial and venous waveforms.  Other findings  Moderate amount of free fluid is present.  IMPRESSION: No evidence of ovarian torsion.  Myometrium is heterogeneous. Fibroids or adenomyosis are not excluded. MRI may be helpful.  Large cystic lesion in the left ovary with thick septations. Cystic neoplasm is not excluded. Surgical consultation is warranted.   Electronically Signed   By: Maryclare BeanArt  Hoss M.D.   On: 01/25/2014 22:08     EKG  Interpretation None      MDM   Final diagnoses:  Abdominal pain  Nausea and vomiting  Ovarian cyst    Patient was just discharged from the ED earlier this evening.  She states that after getting home she had more nausea, vomiting, and pain after the pain medicine wore off.  She was not able to get her medications.  I will treat the patient's pain and nausea here.  Fluid challenge.  Anticipate discharge back to home with instructions to follow-up with GYN and fill medications.  7:56 AM Patient discussed with teaching service who will admit for intractable vomiting.  [facility].  Discussed with Dr. Dierdre Highmanpitz, who agrees with the plan.    Roxy Horsemanobert Louella Medaglia, PA-C 01/26/14 [phone]

## 2014-01-26 NOTE — ED Notes (Signed)
Pt given apple juice and ginger ale

## 2014-01-26 NOTE — Discharge Instructions (Signed)
Abdominal Pain, Women °Abdominal (stomach, pelvic, or belly) pain can be caused by many things. It is important to tell your doctor: °· The location of the pain. °· Does it come and go or is it present all the time? °· Are there things that start the pain (eating certain foods, exercise)? °· Are there other symptoms associated with the pain (fever, nausea, vomiting, diarrhea)? °All of this is helpful to know when trying to find the cause of the pain. °CAUSES  °· Stomach: virus or bacteria infection, or ulcer. °· Intestine: appendicitis (inflamed appendix), regional ileitis (Crohn's disease), ulcerative colitis (inflamed colon), irritable bowel syndrome, diverticulitis (inflamed diverticulum of the colon), or cancer of the stomach or intestine. °· Gallbladder disease or stones in the gallbladder. °· Kidney disease, kidney stones, or infection. °· Pancreas infection or cancer. °· Fibromyalgia (pain disorder). °· Diseases of the female organs: °· Uterus: fibroid (non-cancerous) tumors or infection. °· Fallopian tubes: infection or tubal pregnancy. °· Ovary: cysts or tumors. °· Pelvic adhesions (scar tissue). °· Endometriosis (uterus lining tissue growing in the pelvis and on the pelvic organs). °· Pelvic congestion syndrome (female organs filling up with blood just before the menstrual period). °· Pain with the menstrual period. °· Pain with ovulation (producing an egg). °· Pain with an IUD (intrauterine device, birth control) in the uterus. °· Cancer of the female organs. °· Functional pain (pain not caused by a disease, may improve without treatment). °· Psychological pain. °· Depression. °DIAGNOSIS  °Your doctor will decide the seriousness of your pain by doing an examination. °· Blood tests. °· X-rays. °· Ultrasound. °· CT scan (computed tomography, special type of X-ray). °· MRI (magnetic resonance imaging). °· Cultures, for infection. °· Barium enema (dye inserted in the large intestine, to better view it with  X-rays). °· Colonoscopy (looking in intestine with a lighted tube). °· Laparoscopy (minor surgery, looking in abdomen with a lighted tube). °· Major abdominal exploratory surgery (looking in abdomen with a large incision). °TREATMENT  °The treatment will depend on the cause of the pain.  °· Many cases can be observed and treated at home. °· Over-the-counter medicines recommended by your caregiver. °· Prescription medicine. °· Antibiotics, for infection. °· Birth control pills, for painful periods or for ovulation pain. °· Hormone treatment, for endometriosis. °· Nerve blocking injections. °· Physical therapy. °· Antidepressants. °· Counseling with a psychologist or psychiatrist. °· Minor or major surgery. °HOME CARE INSTRUCTIONS  °· Do not take laxatives, unless directed by your caregiver. °· Take over-the-counter pain medicine only if ordered by your caregiver. Do not take aspirin because it can cause an upset stomach or bleeding. °· Try a clear liquid diet (broth or water) as ordered by your caregiver. Slowly move to a bland diet, as tolerated, if the pain is related to the stomach or intestine. °· Have a thermometer and take your temperature several times a day, and record it. °· Bed rest and sleep, if it helps the pain. °· Avoid sexual intercourse, if it causes pain. °· Avoid stressful situations. °· Keep your follow-up appointments and tests, as your caregiver orders. °· If the pain does not go away with medicine or surgery, you may try: °· Acupuncture. °· Relaxation exercises (yoga, meditation). °· Group therapy. °· Counseling. °SEEK MEDICAL CARE IF:  °· You notice certain foods cause stomach pain. °· Your home care treatment is not helping your pain. °· You need stronger pain medicine. °· You want your IUD removed. °· You feel faint or   lightheaded.  You develop nausea and vomiting.  You develop a rash.  You are having side effects or an allergy to your medicine. SEEK IMMEDIATE MEDICAL CARE IF:   Your  pain does not go away or gets worse.  You have a fever.  Your pain is felt only in portions of the abdomen. The right side could possibly be appendicitis. The left lower portion of the abdomen could be colitis or diverticulitis.  You are passing blood in your stools (bright red or black tarry stools, with or without vomiting).  You have blood in your urine.  You develop chills, with or without a fever.  You pass out. MAKE SURE YOU:   Understand these instructions.  Will watch your condition.  Will get help right away if you are not doing well or get worse. Document Released: 07/20/2007 Document Revised: 12/15/2011 Document Reviewed: 08/09/2009 [facility] Patient Information 2014 [place], [place]. Discharge instruction given to pt

## 2014-01-27 ENCOUNTER — Observation Stay (HOSPITAL_COMMUNITY): Payer: MEDICAID

## 2014-01-27 ENCOUNTER — Observation Stay (HOSPITAL_COMMUNITY): Payer: Self-pay

## 2014-01-27 DIAGNOSIS — L988 Other specified disorders of the skin and subcutaneous tissue: Secondary | ICD-10-CM

## 2014-01-27 LAB — MAGNESIUM: Magnesium: 1.9 mg/dL (ref 1.5–2.5)

## 2014-01-27 LAB — BASIC METABOLIC PANEL
BUN: 10 mg/dL (ref 6–23)
CHLORIDE: 107 meq/L (ref 96–112)
CO2: 21 meq/L (ref 19–32)
CREATININE: 0.51 mg/dL (ref 0.50–1.10)
Calcium: 8.7 mg/dL (ref 8.4–10.5)
GFR calc Af Amer: >90 mL/min (ref >90)
GFR calc non Af Amer: >90 mL/min (ref >90)
GLUCOSE: 95 mg/dL (ref 70–99)
Potassium: 3.9 mEq/L (ref 3.7–5.3)
Sodium: 142 mEq/L (ref 137–147)

## 2014-01-27 LAB — CLOSTRIDIUM DIFFICILE BY PCR: Toxigenic C. Difficile by PCR: NEGATIVE

## 2014-01-27 MED ORDER — OXYCODONE HCL 30 MG PO TABS: 30.0000 mg | ORAL_TABLET | Freq: Three times a day (TID) | ORAL | Status: AC | PRN

## 2014-01-27 MED ORDER — OXYBUTYNIN CHLORIDE ER 5 MG PO TB24: 5.0000 mg | ORAL_TABLET | Freq: Every day | ORAL | Status: DC

## 2014-01-27 MED ORDER — PROMETHAZINE HCL 25 MG PO TABS: 25.0000 mg | ORAL_TABLET | Freq: Three times a day (TID) | ORAL | Status: AC | PRN

## 2014-01-27 MED ORDER — OXYBUTYNIN CHLORIDE ER 5 MG PO TB24: 5.0000 mg | ORAL_TABLET | Freq: Every day | ORAL | Status: AC

## 2014-01-27 MED ORDER — OXYCODONE-ACETAMINOPHEN 5-325 MG PO TABS: 1.0000 | ORAL_TABLET | ORAL | Status: DC | PRN

## 2014-01-27 NOTE — Discharge Instructions (Signed)
1. You have the following appointments scheduled:  [facility]  On 02/09/2014 10:00 AM  [address] [place] [id number] [phone]   [facility]   [address] [place] [id number] Fuller SongLEASE CALL [phone] on Monday to schedule a follow up appointment.   2. Please take all medications as prescribed. Take Phenergan 25 mg every 8 hours ONLY IF NEEDED for nausea.   3. If you have worsening of your symptoms or new symptoms arise, please call the clinic [phone]), or go to the ER immediately if symptoms are severe.   You have done a great job in taking all your medications. I appreciate it very much. Please continue doing that.  Abdominal Pain, Adult Many things can cause abdominal pain. Usually, abdominal pain is not caused by a disease and will improve without treatment. It can often be observed and treated at home. Your health care provider will do a physical exam and possibly order blood tests and X-rays to help determine the seriousness of your pain. However, in many cases, more time must pass before a clear cause of the pain can be found. Before that point, your health care provider may not know if you need more testing or further treatment. HOME CARE INSTRUCTIONS  Monitor your abdominal pain for any changes. The following actions may help to alleviate any discomfort you are experiencing:  Only take over-the-counter or prescription medicines as directed by your health care provider.  Do not take laxatives unless directed to do so by your health care provider.  Try a clear liquid diet (broth, tea, or water) as directed by your health care provider. Slowly move to a bland diet as tolerated. SEEK MEDICAL CARE IF:  You have unexplained abdominal pain.  You have abdominal pain associated with nausea or diarrhea.  You have pain when you urinate or have a bowel movement.  You experience abdominal pain that wakes you  in the night.  You have abdominal pain that is worsened or improved by eating food.  You have abdominal pain that is worsened with eating fatty foods. SEEK IMMEDIATE MEDICAL CARE IF:   Your pain does not go away within 2 hours.  You have a fever.  You keep throwing up (vomiting).  Your pain is felt only in portions of the abdomen, such as the right side or the left lower portion of the abdomen.  You pass bloody or black tarry stools. MAKE SURE YOU:  Understand these instructions.   Will watch your condition.   Will get help right away if you are not doing well or get worse.  Document Released: 07/02/2005 Document Revised: 07/13/2013 Document Reviewed: 06/01/2013 [facility] Patient Information 2014 [place], [place].

## 2014-01-27 NOTE — Progress Notes (Signed)
Faculty Practice OB/GYN Attending Note  Patient has a complex left ovarian cyst and was admitted for pain. CA-125 was 4 for 01/26/14.  Dr. Kem KaysPaya (Attending MD) was called and results discussed. Patient will need outpatient follow up, repeat ultrasound in 6-8 weeks.  Analgesia should be given as indicated. Infection and pain precautions will be discussed with patient.  Dr. Kem KaysPaya informed me that patient will likely be discharged today. We will sign off for now.    Appreciate this interesting consult. Call [phone] for any further GYN concerns.    Jaynie CollinsUGONNA  Samyrah Bruster, MD, FACOG Attending Obstetrician & Gynecologist Faculty Practice, [facility] [place]

## 2014-01-27 NOTE — Progress Notes (Signed)
Late Entry:   Noticed that patient had left her room noted she had  left no more than  to go to vending machine.  Had spoke to patient in regards to safety issues of leaving off unit and contact precautions. Pt made aware that security would be called if patient left room d/t safety measures and patient would need order from MD to leave unit.  Patient stated she understood safety measures and infection prevention measures. Will continue to monitor.
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
## 2014-01-27 NOTE — Progress Notes (Signed)
Noticed pt had empty [employer] sandwich wrap and empty soup cup at bedside. Also noticed pt had eaten cookies. Pt had NO complains of nausea or vomiting throughout the night to indicate intolerance of regular diet.

## 2014-01-27 NOTE — ED Provider Notes (Signed)
Medical screening examination/treatment/procedure(s) were performed by non-physician practitioner and as supervising physician I was immediately available for consultation/collaboration.   EKG Interpretation None       Sunnie NielsenBrian Joylyn Duggin, MD 01/27/14 [phone]
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
## 2014-02-01 NOTE — Discharge Summary (Signed)
  Date: 02/01/2014  Patient name: Mariah Gray  Medical record number: [id number]  Date of birth: 1981/02/25   This patient has been seen and the plan of care was discussed with the house staff. Please see their note for complete details. I concur with their findings and plan.  Jonah BlueAlejandro Laina Guerrieri, DO, FACP [facility] 02/01/2014, 12:28 PM

## 2014-02-09 ENCOUNTER — Inpatient Hospital Stay: Payer: Self-pay

## 2015-06-11 IMAGING — US US PELVIS COMPLETE
1 series · 13 of 25 positions shown · non-contrast
Comparison: None.

CLINICAL DATA: Pelvic pain

EXAM:
TRANSABDOMINAL AND TRANSVAGINAL ULTRASOUND OF PELVIS
DOPPLER ULTRASOUND OF OVARIES
TECHNIQUE: Both transabdominal and transvaginal ultrasound examinations of the
pelvis were performed. Transabdominal technique was performed for
global imaging of the pelvis including uterus, ovaries, adnexal
regions, and pelvic cul-de-sac.
It was necessary to proceed with endovaginal exam following the
transabdominal exam to visualize the adnexae. Color and duplex
Doppler ultrasound was utilized to evaluate blood flow to the
ovaries.

[Series 1: us pelvis complete · 0.24mm/px · 13 of 42 slices shown]
[im 1/42]
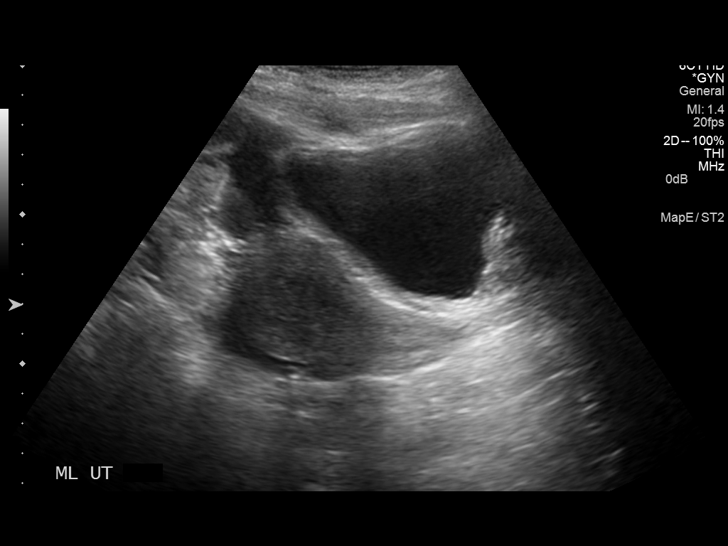
[im 4/42]
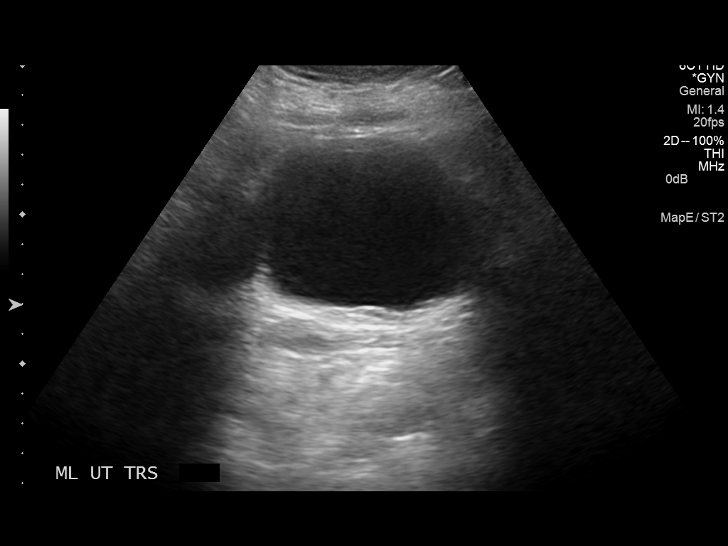
[im 7/42]
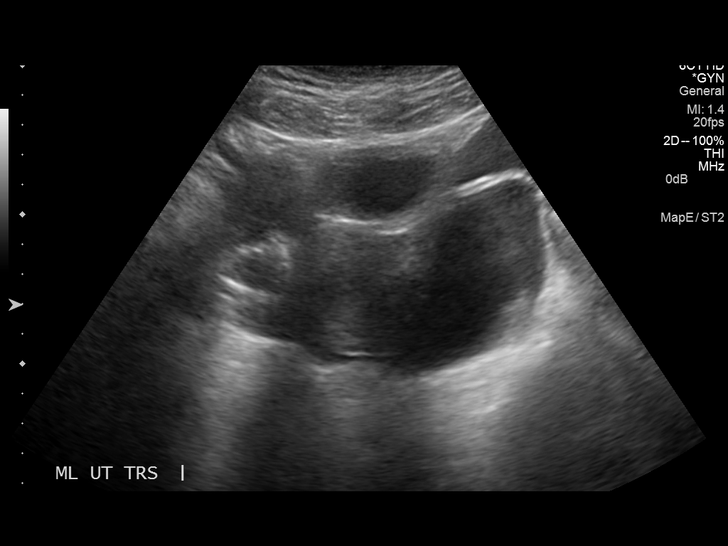
[im 11/42]
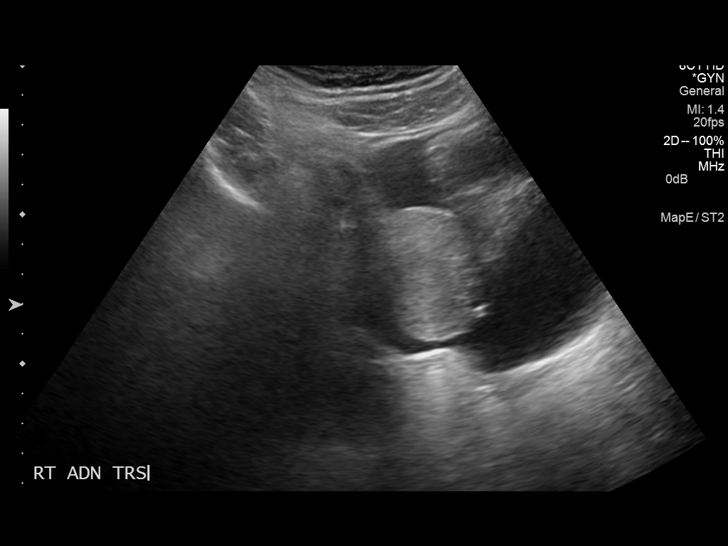
[im 14/42]
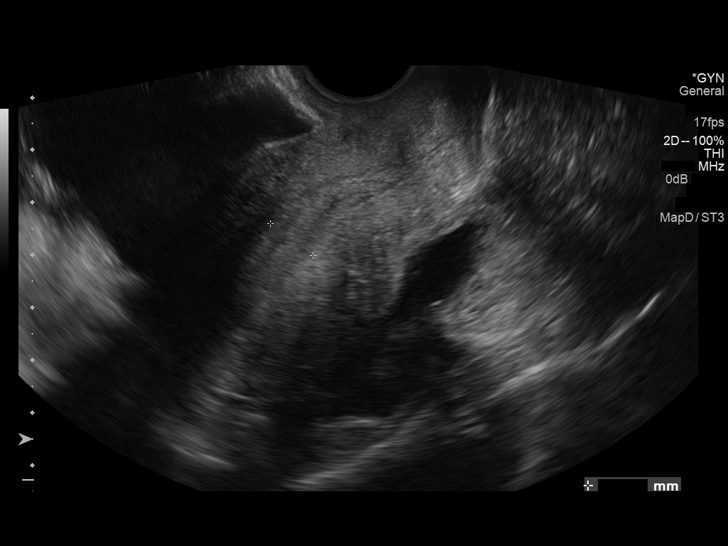
[im 18/42]
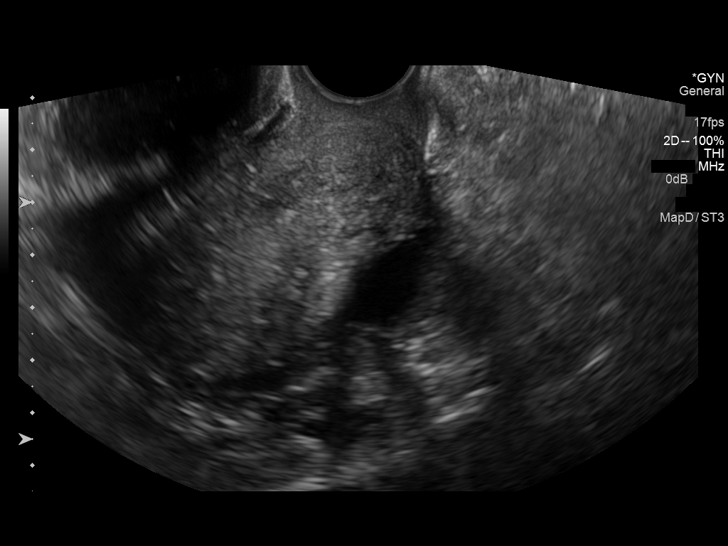
[im 21/42]
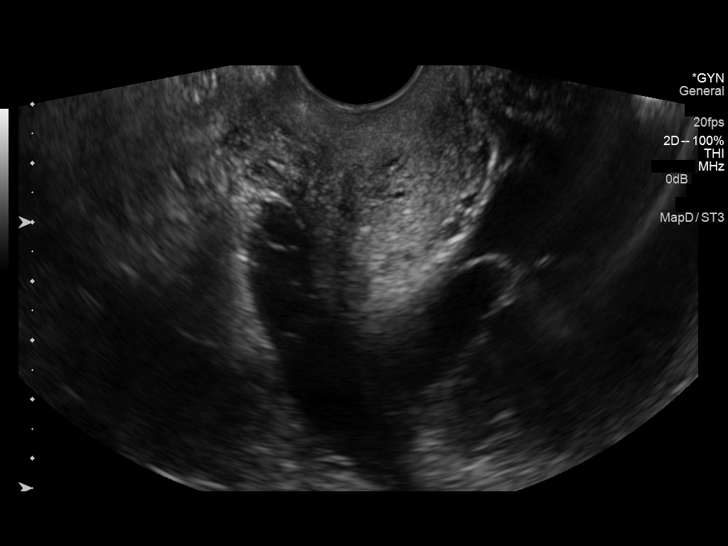
[im 24/42]
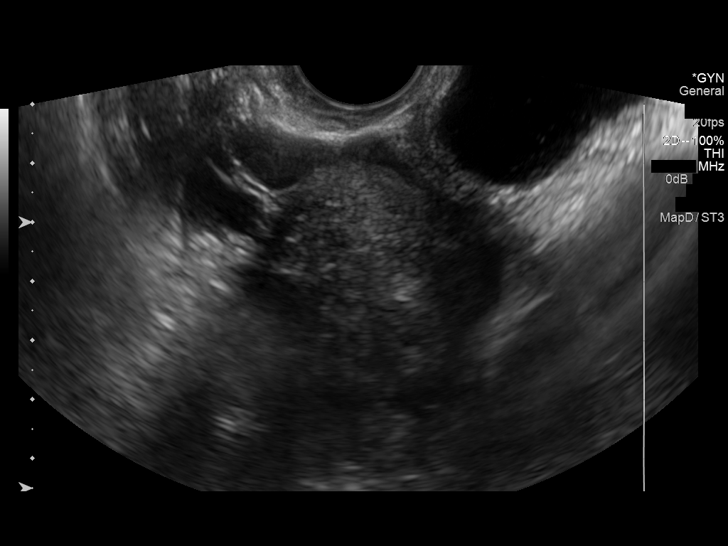
[im 28/42]
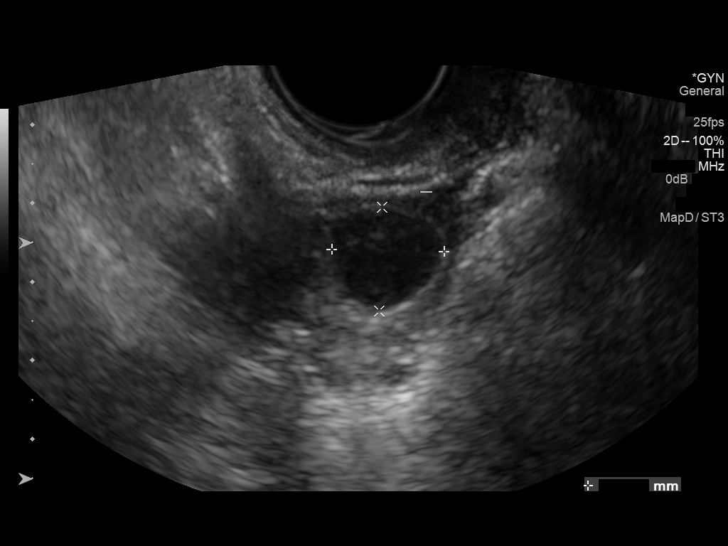
[im 31/42]
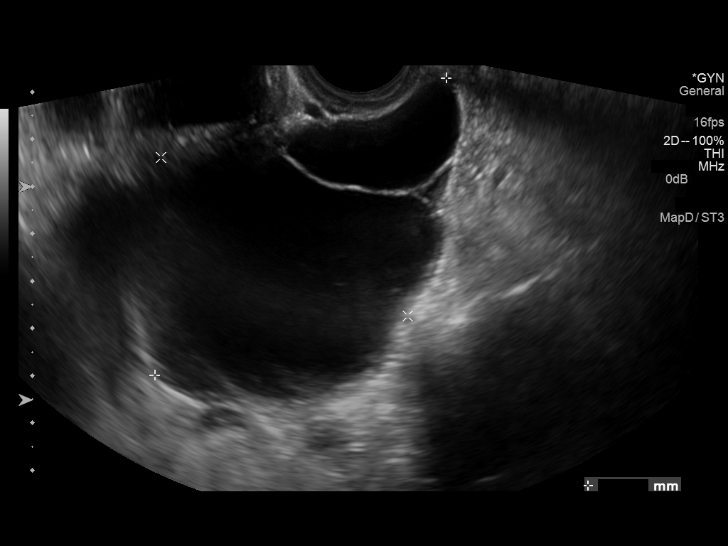
[im 35/42]
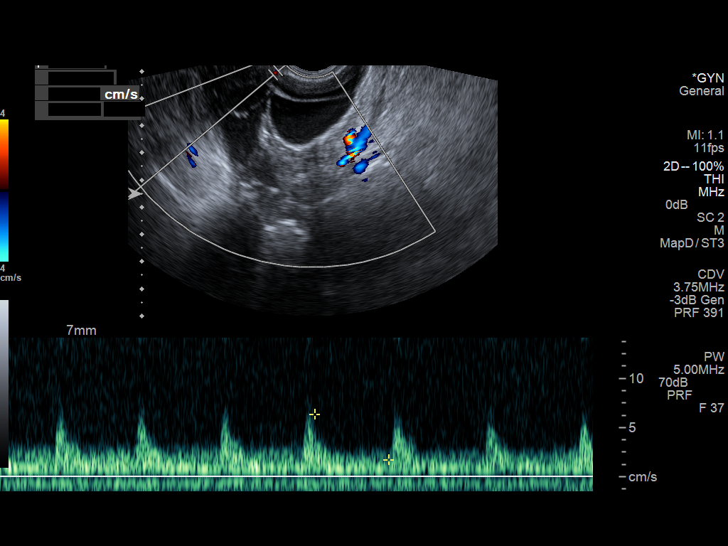
[im 38/42]
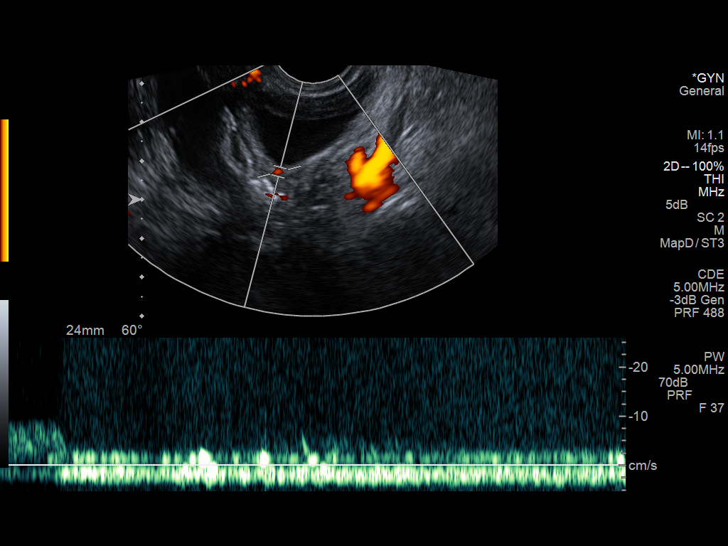
[im 42/42]
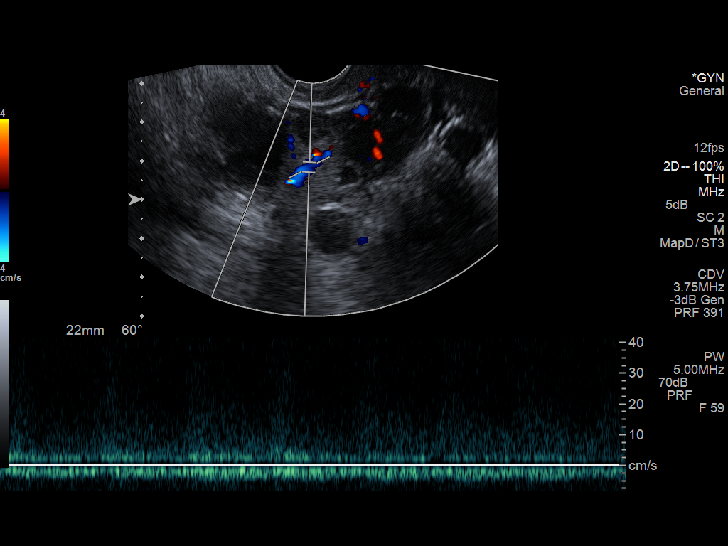

[13 of 25 positions shown; findings below may reference images not displayed]

FINDINGS: Uterus

Measurements: 7.2 x 4.6 x 4.6 cm. The myometrium is heterogeneous
without focal mass.

Endometrium

Thickness: 10 mm an uniform.  No focal abnormality visualized.

Right ovary

Measurements: 2.4 x 2.1 x 1.7 cm. Normal appearance/no adnexal mass.

Left ovary

Measurements: 8.8 x 6.2 x 5.6 cm. A large cystic lesion with thick
septations occupies most of the left ovary. Little if any internal
vascularity within the cystic lesion.

Pulsed Doppler evaluation of both ovaries demonstrates normal
low-resistance arterial and venous waveforms.

Other findings

Moderate amount of free fluid is present.
IMPRESSION: No evidence of ovarian torsion.

Myometrium is heterogeneous. Fibroids or adenomyosis are not
excluded. MRI may be helpful.

Large cystic lesion in the left ovary with thick septations. Cystic
neoplasm is not excluded. Surgical consultation is warranted.
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
